# Patient Record
Sex: Female | Born: 1950 | Race: White | Hispanic: No | Marital: Married | Smoking: Former smoker
Health system: Southern US, Community
[De-identification: ages and names within clinical notes are randomized; demographics above are authoritative.]

## PROBLEM LIST (undated history)

## (undated) HISTORY — PX: TUBAL LIGATION: SHX77

## (undated) HISTORY — PX: EYE SURGERY: SHX253

---

## 1998-05-16 ENCOUNTER — Other Ambulatory Visit: Admission: RE | Admit: 1998-05-16 | Discharge: 1998-05-16 | Payer: Self-pay | Admitting: Obstetrics and Gynecology

## 1998-07-03 ENCOUNTER — Other Ambulatory Visit: Admission: RE | Admit: 1998-07-03 | Discharge: 1998-07-03 | Payer: Self-pay | Admitting: Obstetrics and Gynecology

## 1999-02-15 ENCOUNTER — Other Ambulatory Visit: Admission: RE | Admit: 1999-02-15 | Discharge: 1999-02-15 | Payer: Self-pay | Admitting: Obstetrics and Gynecology

## 2000-05-26 ENCOUNTER — Other Ambulatory Visit: Admission: RE | Admit: 2000-05-26 | Discharge: 2000-05-26 | Payer: Self-pay | Admitting: Obstetrics and Gynecology

## 2000-08-13 ENCOUNTER — Other Ambulatory Visit: Admission: RE | Admit: 2000-08-13 | Discharge: 2000-08-13 | Payer: Self-pay | Admitting: Obstetrics and Gynecology

## 2001-01-09 ENCOUNTER — Other Ambulatory Visit: Admission: RE | Admit: 2001-01-09 | Discharge: 2001-01-09 | Payer: Self-pay | Admitting: Obstetrics and Gynecology

## 2001-06-24 ENCOUNTER — Other Ambulatory Visit: Admission: RE | Admit: 2001-06-24 | Discharge: 2001-06-24 | Payer: Self-pay | Admitting: Obstetrics and Gynecology

## 2002-08-17 ENCOUNTER — Other Ambulatory Visit: Admission: RE | Admit: 2002-08-17 | Discharge: 2002-08-17 | Payer: Self-pay | Admitting: Obstetrics and Gynecology

## 2003-02-10 ENCOUNTER — Other Ambulatory Visit: Admission: RE | Admit: 2003-02-10 | Discharge: 2003-02-10 | Payer: Self-pay | Admitting: Obstetrics and Gynecology

## 2003-08-31 ENCOUNTER — Other Ambulatory Visit: Admission: RE | Admit: 2003-08-31 | Discharge: 2003-08-31 | Payer: Self-pay | Admitting: Obstetrics and Gynecology

## 2003-09-08 ENCOUNTER — Encounter: Admission: RE | Admit: 2003-09-08 | Discharge: 2003-09-08 | Payer: Self-pay | Admitting: Obstetrics and Gynecology

## 2005-02-14 ENCOUNTER — Other Ambulatory Visit: Admission: RE | Admit: 2005-02-14 | Discharge: 2005-02-14 | Payer: Self-pay | Admitting: Obstetrics and Gynecology

## 2005-03-04 ENCOUNTER — Encounter: Admission: RE | Admit: 2005-03-04 | Discharge: 2005-03-04 | Payer: Self-pay | Admitting: Obstetrics and Gynecology

## 2013-09-28 ENCOUNTER — Telehealth: Payer: Self-pay

## 2013-09-28 NOTE — Telephone Encounter (Signed)
Pt was referred by Dr. Karie Kirks for screening colonoscopy. Called and [facility] for a return call.

## 2013-09-28 NOTE — Telephone Encounter (Signed)
Pt was returning a call to the triage nurse that she wanted to wait before scheduling her colonoscopy due to losing her job this week. She will contact us when she is ready

## 2013-09-29 NOTE — Telephone Encounter (Signed)
Letter faxed to PCP.  

## 2013-09-29 NOTE — Telephone Encounter (Signed)
See separate phone note dated 09/28/2013. Pt to call when ready and letter sent to PCP.

## 2015-05-03 ENCOUNTER — Other Ambulatory Visit: Payer: Self-pay | Admitting: Obstetrics and Gynecology

## 2015-05-03 DIAGNOSIS — R928 Other abnormal and inconclusive findings on diagnostic imaging of breast: Secondary | ICD-10-CM

## 2015-05-19 ENCOUNTER — Ambulatory Visit
Admission: RE | Admit: 2015-05-19 | Discharge: 2015-05-19 | Disposition: A | Discharge status: Home / Self Care | Payer: 59 | Source: Ambulatory Visit | Attending: Obstetrics and Gynecology | Admitting: Obstetrics and Gynecology

## 2015-05-19 DIAGNOSIS — R928 Other abnormal and inconclusive findings on diagnostic imaging of breast: Secondary | ICD-10-CM

## 2015-10-23 ENCOUNTER — Other Ambulatory Visit: Payer: Self-pay | Admitting: Obstetrics and Gynecology

## 2015-10-23 DIAGNOSIS — N6320 Unspecified lump in the left breast, unspecified quadrant: Secondary | ICD-10-CM

## 2015-11-17 ENCOUNTER — Ambulatory Visit
Admission: RE | Admit: 2015-11-17 | Discharge: 2015-11-17 | Disposition: A | Discharge status: Home / Self Care | Payer: 59 | Source: Ambulatory Visit | Attending: Obstetrics and Gynecology | Admitting: Obstetrics and Gynecology

## 2015-11-17 DIAGNOSIS — N6320 Unspecified lump in the left breast, unspecified quadrant: Secondary | ICD-10-CM

## 2016-04-12 ENCOUNTER — Other Ambulatory Visit: Payer: Self-pay | Admitting: Obstetrics and Gynecology

## 2016-04-12 DIAGNOSIS — N63 Unspecified lump in unspecified breast: Secondary | ICD-10-CM

## 2016-05-10 ENCOUNTER — Ambulatory Visit
Admission: RE | Admit: 2016-05-10 | Discharge: 2016-05-10 | Disposition: A | Discharge status: Home / Self Care | Payer: 59 | Source: Ambulatory Visit | Attending: Obstetrics and Gynecology | Admitting: Obstetrics and Gynecology

## 2016-05-10 DIAGNOSIS — N63 Unspecified lump in unspecified breast: Secondary | ICD-10-CM

## 2016-05-21 ENCOUNTER — Other Ambulatory Visit: Payer: 59

## 2016-08-21 ENCOUNTER — Telehealth: Payer: Self-pay

## 2016-08-21 NOTE — Telephone Encounter (Signed)
Janett Billow from PCP office called to check the status of the referral they had sent. I told her we had the referral and the triage nurse would be contacting the patient.

## 2016-08-23 ENCOUNTER — Telehealth: Payer: Self-pay

## 2016-08-23 NOTE — Telephone Encounter (Signed)
Gastroenterology Pre-Procedure Review  Request Date: 08/23/2016 Requesting Physician: Dr. Karie Kirks  PATIENT REVIEW QUESTIONS: The patient responded to the following health history questions as indicated:    1. Diabetes Melitis: no 2. Joint replacements in the past 12 months: no 3. Major health problems in the past 3 months: no 4. Has an artificial valve or MVP: no 5. Has a defibrillator: no 6. Has been advised in past to take antibiotics in advance of a procedure like teeth cleaning: no 7. Family history of colon cancer: no  8. Alcohol Use: no 9. History of sleep apnea: no  10. History of coronary artery or other vascular stents placed within the last 12 months: no    MEDICATIONS & ALLERGIES:    Patient reports the following regarding taking any blood thinners:   Plavix? no Aspirin? no Coumadin? no Brilinta? no Xarelto? no Eliquis? no Pradaxa? no Savaysa? no Effient? no  Patient confirms/reports the following medications:  Current Outpatient Prescriptions  Medication Sig Dispense Refill  . loratadine (CLARITIN) 10 MG tablet Take 10 mg by mouth daily.     No current facility-administered medications for this visit.     Patient confirms/reports the following allergies:  No Known Allergies  No orders of the defined types were placed in this encounter.   AUTHORIZATION INFORMATION Primary Insurance:   ID #:   Group #:  Pre-Cert / Auth required:  Pre-Cert / Auth #:   Secondary Insurance:  ID #:   Group #:  Pre-Cert / Auth required:  Pre-Cert / Auth #:   SCHEDULE INFORMATION: Procedure has been scheduled as follows:  Date: 09/13/2016               Time:  8:30 AM Location: [facility] Short Stay  This Gastroenterology Pre-Precedure Review Form is being routed to the following provider(s): Dr. Oneida Alar

## 2016-08-23 NOTE — Telephone Encounter (Signed)
See separate triage.  

## 2016-09-02 ENCOUNTER — Other Ambulatory Visit: Payer: Self-pay

## 2016-09-02 DIAGNOSIS — Z1211 Encounter for screening for malignant neoplasm of colon: Secondary | ICD-10-CM

## 2016-09-02 NOTE — Telephone Encounter (Signed)
NO PA is needed 

## 2016-09-02 NOTE — Telephone Encounter (Signed)

## 2016-09-03 MED ORDER — NA SULFATE-K SULFATE-MG SULF 17.5-3.13-1.6 GM/177ML PO SOLN: 1.0000 | ORAL | 0 refills | Status: DC

## 2016-09-03 NOTE — Telephone Encounter (Signed)
Rx sent to the pharmacy and instructions mailed to pt.  

## 2016-09-13 ENCOUNTER — Encounter (HOSPITAL_COMMUNITY)
Admission: RE | Disposition: A | Discharge status: Home / Self Care | Payer: Self-pay | Source: Ambulatory Visit | Attending: Gastroenterology

## 2016-09-13 ENCOUNTER — Encounter (HOSPITAL_COMMUNITY): Payer: Self-pay | Admitting: Gastroenterology

## 2016-09-13 ENCOUNTER — Ambulatory Visit (HOSPITAL_COMMUNITY)
Admission: RE | Admit: 2016-09-13 | Discharge: 2016-09-13 | Disposition: A | Discharge status: Home / Self Care | Payer: 59 | Source: Ambulatory Visit | Attending: Gastroenterology | Admitting: Gastroenterology

## 2016-09-13 DIAGNOSIS — K648 Other hemorrhoids: Secondary | ICD-10-CM | POA: Insufficient documentation

## 2016-09-13 DIAGNOSIS — Z9851 Tubal ligation status: Secondary | ICD-10-CM | POA: Diagnosis not present

## 2016-09-13 DIAGNOSIS — D123 Benign neoplasm of transverse colon: Secondary | ICD-10-CM | POA: Diagnosis not present

## 2016-09-13 DIAGNOSIS — Z1211 Encounter for screening for malignant neoplasm of colon: Secondary | ICD-10-CM

## 2016-09-13 DIAGNOSIS — Q438 Other specified congenital malformations of intestine: Secondary | ICD-10-CM | POA: Insufficient documentation

## 2016-09-13 DIAGNOSIS — Z87891 Personal history of nicotine dependence: Secondary | ICD-10-CM | POA: Diagnosis not present

## 2016-09-13 DIAGNOSIS — D122 Benign neoplasm of ascending colon: Secondary | ICD-10-CM | POA: Diagnosis not present

## 2016-09-13 DIAGNOSIS — Z1212 Encounter for screening for malignant neoplasm of rectum: Secondary | ICD-10-CM

## 2016-09-13 HISTORY — PX: COLONOSCOPY: SHX5424

## 2016-09-13 SURGERY — COLONOSCOPY
Anesthesia: Moderate Sedation

## 2016-09-13 MED ORDER — MIDAZOLAM HCL 5 MG/5ML IJ SOLN
INTRAMUSCULAR | Status: AC
  Filled 2016-09-13: qty 5

## 2016-09-13 MED ORDER — STERILE WATER FOR IRRIGATION IR SOLN
Status: DC | PRN
  Administered 2016-09-13: 09:00:00

## 2016-09-13 MED ORDER — SODIUM CHLORIDE 0.9 % IJ SOLN
PREFILLED_SYRINGE | INTRAMUSCULAR | Status: DC | PRN
  Administered 2016-09-13: 2 mL

## 2016-09-13 MED ORDER — SODIUM CHLORIDE 0.9 % IV SOLN
INTRAVENOUS | Status: DC
  Administered 2016-09-13: 1000 mL via INTRAVENOUS

## 2016-09-13 MED ORDER — SPOT INK MARKER SYRINGE KIT
PACK | SUBMUCOSAL | Status: AC
  Filled 2016-09-13: qty 5

## 2016-09-13 MED ORDER — LIDOCAINE VISCOUS 2 % MT SOLN
OROMUCOSAL | Status: AC
  Filled 2016-09-13: qty 15

## 2016-09-13 MED ORDER — EPINEPHRINE PF 1 MG/10ML IJ SOSY
PREFILLED_SYRINGE | INTRAMUSCULAR | Status: AC
  Filled 2016-09-13: qty 10

## 2016-09-13 MED ORDER — SPOT INK MARKER SYRINGE KIT
PACK | SUBMUCOSAL | Status: DC | PRN
  Administered 2016-09-13: 1 mL via SUBMUCOSAL

## 2016-09-13 MED ORDER — MEPERIDINE HCL 100 MG/ML IJ SOLN
INTRAMUSCULAR | Status: DC | PRN
  Administered 2016-09-13 (×3): 25 mg via INTRAVENOUS

## 2016-09-13 MED ORDER — MEPERIDINE HCL 100 MG/ML IJ SOLN
INTRAMUSCULAR | Status: AC
  Filled 2016-09-13: qty 1

## 2016-09-13 MED ORDER — MIDAZOLAM HCL 5 MG/5ML IJ SOLN
INTRAMUSCULAR | Status: DC | PRN
  Administered 2016-09-13: 1 mg via INTRAVENOUS
  Administered 2016-09-13 (×2): 2 mg via INTRAVENOUS

## 2016-09-13 NOTE — Op Note (Signed)
[facility] Patient Name: Brandi Moyer Procedure Date: 09/13/2016 6:02 AM MRN: [id number] Date of Birth: 30-Sep-1950 Attending MD: Barney Drain , MD CSN: [id number] Age: 66 Admit Type: Outpatient Procedure:                Colonoscopy WITH SNARE POLYPECTOMY & SUBMUCOSAL                            INJECTION Indications:              Screening for colorectal malignant neoplasm Providers:                Barney Drain, MD, Janeece Riggers, RN, Rosina Lowenstein, RN Referring MD:             Newt Minion, MD Medicines:                Meperidine 75 mg IV, Midazolam 5 mg IV Complications:            No immediate complications. Estimated Blood Loss:     Estimated blood loss was minimal. Procedure:                Pre-Anesthesia Assessment:                           - Prior to the procedure, a History and Physical                            was performed, and patient medications and                            allergies were reviewed. The patient's tolerance of                            previous anesthesia was also reviewed. The risks                            and benefits of the procedure and the sedation                            options and risks were discussed with the patient.                            All questions were answered, and informed consent                            was obtained. Prior Anticoagulants: The patient has                            taken no previous anticoagulant or antiplatelet                            agents. ASA Grade Assessment: I - A normal, healthy                            patient. After reviewing the risks and benefits,  the patient was deemed in satisfactory condition to                            undergo the procedure. After obtaining informed                            consent, the colonoscope was passed under direct                            vision. Throughout the procedure, the patient's                            blood  pressure, pulse, and oxygen saturations were                            monitored continuously. The EC-[id number] ([id number])                            scope was introduced through the anus and advanced                            to the the cecum, identified by appendiceal orifice                            and ileocecal valve. The colonoscopy was performed                            without difficulty. The patient tolerated the                            procedure well. The quality of the bowel                            preparation was excellent. The ileocecal valve,                            appendiceal orifice, and rectum were photographed. Scope In: 9:04:15 AM Scope Out: 9:25:31 AM Scope Withdrawal Time: 0 hours 19 minutes 55 seconds  Total Procedure Duration: 0 hours 21 minutes 16 seconds  Findings:      A 15 mm polyp was found in the splenic flexure. The polyp was sessile.       The polyp was removed with a piecemeal technique using a hot snare.       Resection and retrieval were complete. Area was tattooed with an       injection of 1 mL of Spot (carbon black). Area was successfully injected       with 2 mL of a 1:10,000 solution of epinephrine for a lift polypectomy.       EDGES CAUTERIZED WITH TIP OF SNARE.      A 4 mm polyp was found in the proximal ascending colon. The polyp was       sessile. The polyp was removed with a cold snare. Resection and       retrieval were complete.      The recto-sigmoid colon was mildly redundant.  Internal hemorrhoids were found during retroflexion. The hemorrhoids       were moderate. Impression:               - One 15 mm polyp at the splenic flexure, removed                            piecemeal using a hot snare. Resected and                            retrieved. Tattooed. Injected.                           - One 4 mm polyp in the proximal ascending colon,                            removed with a cold snare. Resected and retrieved.                            - Redundant LEFT colon.                           - Internal hemorrhoids. Moderate Sedation:      Moderate (conscious) sedation was administered by the endoscopy nurse       and supervised by the endoscopist. The following parameters were       monitored: oxygen saturation, heart rate, blood pressure, and response       to care. Total physician intraservice time was 35 minutes. Recommendation:           - Repeat colonoscopy in 1 year for surveillance DUE                            TO PIECEMEAL RESECTION. ALL FIRST DEGREE RELATIVES                            NEED TCS AT AGE 25.                           - High fiber diet.                           - Continue present medications.                           - Await pathology results.                           - Patient has a contact number available for                            emergencies. The signs and symptoms of potential                            delayed complications were discussed with the  patient. Return to normal activities tomorrow.                            Written discharge instructions were provided to the                            patient. Procedure Code(s):        --- Professional ---                           [phone], Colonoscopy, flexible; with removal of                            tumor(s), polyp(s), or other lesion(s) by snare                            technique                           45381, Colonoscopy, flexible; with directed                            submucosal injection(s), any substance                           99152, Moderate sedation services provided by the                            same physician or other qualified health care                            professional performing the diagnostic or                            therapeutic service that the sedation supports,                            requiring the presence of an independent trained                             observer to assist in the monitoring of the                            patient's level of consciousness and physiological                            status; initial 15 minutes of intraservice time,                            patient age 64 years or older                           [phone], Moderate sedation services; each additional                            15 minutes intraservice  time Diagnosis Code(s):        --- Professional ---                           Z12.11, Encounter for screening for malignant                            neoplasm of colon                           D12.3, Benign neoplasm of transverse colon (hepatic                            flexure or splenic flexure)                           D12.2, Benign neoplasm of ascending colon                           K64.8, Other hemorrhoids                           Q43.8, Other specified congenital malformations of                            intestine CPT copyright 2016 American Medical Association. All rights reserved. The codes documented in this report are preliminary and upon coder review may  be revised to meet current compliance requirements. Barney Drain, MD Barney Drain, MD 09/13/2016 9:59:19 AM This report has been signed electronically. Number of Addenda: 0

## 2016-09-13 NOTE — H&P (Addendum)
  Primary Care Physician:  Robert Bellow, MD Primary Gastroenterologist:  Dr. Oneida Alar  Pre-Procedure History & Physical: HPI:  Brandi Moyer is a 66 y.o. female here for [place].  History reviewed. No pertinent past medical history.  Past Surgical History:  Procedure Laterality Date  . EYE SURGERY     does not remember which eye  . TUBAL LIGATION      Prior to Admission medications   Medication Sig Start Date End Date Taking? Authorizing Provider  loratadine (CLARITIN) 10 MG tablet Take 10 mg by mouth daily.   Yes Historical Provider, MD  Na Sulfate-K Sulfate-Mg Sulf (SUPREP BOWEL PREP KIT) 17.5-3.13-1.6 GM/180ML SOLN Take 1 kit by mouth as directed. 09/03/16  Yes Danie Binder, MD    Allergies as of 09/02/2016  . (No Known Allergies)    Family History  Problem Relation Age of Onset  . Breast cancer Mother   . Bone cancer Mother   . Heart Problems Father     Social History   Social History  . Marital status: Married    Spouse name: N/A  . Number of children: N/A  . Years of education: N/A   Occupational History  . Not on file.   Social History Main Topics  . Smoking status: Former Research scientist (life sciences)  . Smokeless tobacco: Never Used     Comment: quit 1988  . Alcohol use No  . Drug use: No  . Sexual activity: Not on file   Other Topics Concern  . Not on file   Social History Narrative  . No narrative on file    Review of Systems: See HPI, otherwise negative ROS   Physical Exam: BP (!) 141/77   Pulse 85   Temp 98.5 F (36.9 C) (Oral)   Resp 19   Ht 5' 2.5" (1.588 m)   Wt 192 lb (87.1 kg)   SpO2 100%   BMI 34.56 kg/m  General:   Alert,  pleasant and cooperative in NAD Head:  Normocephalic and atraumatic. Neck:  Supple; Lungs:  Clear throughout to auscultation.    Heart:  Regular rate and rhythm. Abdomen:  Soft, nontender and nondistended. Normal bowel sounds, without guarding, and without rebound.   Neurologic:  Alert and  oriented x4;   grossly normal neurologically.  Impression/Plan:     SCREENING  Plan:  1. TCS TODAY. DISCUSSED PROCEDURE, BENEFITS, & RISKS: < 1% chance of medication reaction, bleeding, perforation, or rupture of spleen/liver.

## 2016-09-13 NOTE — Discharge Instructions (Signed)
You had ONE SMALL AND ONE LARGE polyp removed. I TATTOOED AND PLACED TWO METAL CLIPS TO PREVENT BLEEDING FROM THE POLYP BASE IN 7-10 DAYS. You have MODERATE SIZE internal hemorrhoids.   NO MRI FOR 30 DAYS DUE TO METAL CLIP PLACEMENT IN THE COLON.  DRINK WATER TO KEEP YOUR URINE LIGHT YELLOW.  FOLLOW A HIGH FIBER DIET. AVOID ITEMS THAT CAUSE BLOATING & GAS. SEE INFO BELOW.  YOUR BIOPSY RESULTS WILL BE AVAILABLE IN MY CHART AFTER  APR 10 AND MY OFFICE WILL CONTACT YOU IN 10-14 DAYS WITH YOUR RESULTS.   Next colonoscopy in 1 YEAR. YOUR SISTERS, BROTHERS, CHILDREN, AND PARENTS NEED TO HAVE A COLONOSCOPY STARTING AT THE AGE OF 40.    Colonoscopy Care After Read the instructions outlined below and refer to this sheet in the next week. These discharge instructions provide you with general information on caring for yourself after you leave the hospital. While your treatment has been planned according to the most current medical practices available, unavoidable complications occasionally occur. If you have any problems or questions after discharge, call DR. Mykaylah Ballman, [phone].  ACTIVITY  You may resume your regular activity, but move at a slower pace for the next 24 hours.   Take frequent rest periods for the next 24 hours.   Walking will help get rid of the air and reduce the bloated feeling in your belly (abdomen).   No driving for 24 hours (because of the medicine (anesthesia) used during the test).   You may shower.   Do not sign any important legal documents or operate any machinery for 24 hours (because of the anesthesia used during the test).    NUTRITION  Drink plenty of fluids.   You may resume your normal diet as instructed by your doctor.   Begin with a light meal and progress to your normal diet. Heavy or fried foods are harder to digest and may make you feel sick to your stomach (nauseated).   Avoid alcoholic beverages for 24 hours or as instructed.     MEDICATIONS  You may resume your normal medications.   WHAT YOU CAN EXPECT TODAY  Some feelings of bloating in the abdomen.   Passage of more gas than usual.   Spotting of blood in your stool or on the toilet paper  .  IF YOU HAD POLYPS REMOVED DURING THE COLONOSCOPY:  Eat a soft diet IF YOU HAVE NAUSEA, BLOATING, ABDOMINAL PAIN, OR VOMITING.    FINDING OUT THE RESULTS OF YOUR TEST Not all test results are available during your visit. DR. Oneida Alar WILL CALL YOU WITHIN 14 DAYS OF YOUR PROCEDUE WITH YOUR RESULTS. Do not assume everything is normal if you have not heard from DR. Donald Memoli, CALL HER OFFICE AT [phone].  SEEK IMMEDIATE MEDICAL ATTENTION AND CALL THE OFFICE: [phone] IF:  You have more than a spotting of blood in your stool.   Your belly is swollen (abdominal distention).   You are nauseated or vomiting.   You have a temperature over 101F.   You have abdominal pain or discomfort that is severe or gets worse throughout the day.   High-Fiber Diet A high-fiber diet changes your normal diet to include more whole grains, legumes, fruits, and vegetables. Changes in the diet involve replacing refined carbohydrates with unrefined foods. The calorie level of the diet is essentially unchanged. The Dietary Reference Intake (recommended amount) for adult males is 38 grams per day. For adult females, it is 25 grams per day.  Pregnant and lactating women should consume 28 grams of fiber per day. Fiber is the intact part of a plant that is not broken down during digestion. Functional fiber is fiber that has been isolated from the plant to provide a beneficial effect in the body. PURPOSE  Increase stool bulk.   Ease and regulate bowel movements.   Lower cholesterol.   REDUCE RISK OF COLON CANCER  INDICATIONS THAT YOU NEED MORE FIBER  Constipation and hemorrhoids.   Uncomplicated diverticulosis (intestine condition) and irritable bowel syndrome.   Weight  management.   As a protective measure against hardening of the arteries (atherosclerosis), diabetes, and cancer.   GUIDELINES FOR INCREASING FIBER IN THE DIET  Start adding fiber to the diet slowly. A gradual increase of about 5 more grams (2 slices of whole-wheat bread, 2 servings of most fruits or vegetables, or 1 bowl of high-fiber cereal) per day is best. Too rapid an increase in fiber may result in constipation, flatulence, and bloating.   Drink enough water and fluids to keep your urine clear or pale yellow. Water, juice, or caffeine-free drinks are recommended. Not drinking enough fluid may cause constipation.   Eat a variety of high-fiber foods rather than one type of fiber.   Try to increase your intake of fiber through using high-fiber foods rather than fiber pills or supplements that contain small amounts of fiber.   The goal is to change the types of food eaten. Do not supplement your present diet with high-fiber foods, but replace foods in your present diet.   INCLUDE A VARIETY OF FIBER SOURCES  Replace refined and processed grains with whole grains, canned fruits with fresh fruits, and incorporate other fiber sources. White rice, white breads, and most bakery goods contain little or no fiber.   Brown whole-grain rice, buckwheat oats, and many fruits and vegetables are all good sources of fiber. These include: broccoli, Brussels sprouts, cabbage, cauliflower, beets, sweet potatoes, white potatoes (skin on), carrots, tomatoes, eggplant, squash, berries, fresh fruits, and dried fruits.   Cereals appear to be the richest source of fiber. Cereal fiber is found in whole grains and bran. Bran is the fiber-rich outer coat of cereal grain, which is largely removed in refining. In whole-grain cereals, the bran remains. In breakfast cereals, the largest amount of fiber is found in those with "bran" in their names. The fiber content is sometimes indicated on the label.   You may need to  include additional fruits and vegetables each day.   In baking, for 1 cup white flour, you may use the following substitutions:   1 cup whole-wheat flour minus 2 tablespoons.   1/2 cup white flour plus 1/2 cup whole-wheat flour.   Polyps, Colon  A polyp is extra tissue that grows inside your body. Colon polyps grow in the large intestine. The large intestine, also called the colon, is part of your digestive system. It is a long, hollow tube at the end of your digestive tract where your body makes and stores stool. Most polyps are not dangerous. They are benign. This means they are not cancerous. But over time, some types of polyps can turn into cancer. Polyps that are smaller than a pea are usually not harmful. But larger polyps could someday become or may already be cancerous. To be safe, doctors remove all polyps and test them.   WHO GETS POLYPS? Anyone can get polyps, but certain people are more likely than others. You may have a greater chance of  getting polyps if:  You are over 50.   You have had polyps before.   Someone in your family has had polyps.   Someone in your family has had cancer of the large intestine.   Find out if someone in your family has had polyps. You may also be more likely to get polyps if you:   Eat a lot of fatty foods   Smoke   Drink alcohol   Do not exercise  Eat too much    PREVENTION There is not one sure way to prevent polyps. You might be able to lower your risk of getting them if you:  Eat more fruits and vegetables and less fatty food.   Do not smoke.   Avoid alcohol.   Exercise every day.   Lose weight if you are overweight.   Eating more calcium and folate can also lower your risk of getting polyps. Some foods that are rich in calcium are milk, cheese, and broccoli. Some foods that are rich in folate are chickpeas, kidney beans, and spinach.   Hemorrhoids Hemorrhoids are dilated (enlarged) veins around the rectum. Sometimes clots  will form in the veins. This makes them swollen and painful. These are called thrombosed hemorrhoids. Causes of hemorrhoids include:  Constipation.   Straining to have a bowel movement.   HEAVY LIFTING  HOME CARE INSTRUCTIONS  Eat a well balanced diet and drink 6 to 8 glasses of water every day to avoid constipation. You may also use a bulk laxative.   Avoid straining to have bowel movements.   Keep anal area dry and clean.   Do not use a donut shaped pillow or sit on the toilet for long periods. This increases blood pooling and pain.   Move your bowels when your body has the urge; this will require less straining and will decrease pain and pressure.

## 2016-09-17 ENCOUNTER — Encounter (HOSPITAL_COMMUNITY): Payer: Self-pay | Admitting: Gastroenterology

## 2016-09-18 ENCOUNTER — Telehealth: Payer: Self-pay | Admitting: Gastroenterology

## 2016-09-18 NOTE — Telephone Encounter (Signed)
Please call pt. 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   NO MRI FOR 30 DAYS DUE TO METAL CLIP PLACEMENT IN THE COLON.  DRINK WATER TO KEEP YOUR URINE LIGHT YELLOW.  FOLLOW A HIGH FIBER DIET. AVOID ITEMS THAT CAUSE BLOATING & GAS.   Next colonoscopy in 1 YEAR. YOUR SISTERS, BROTHERS, CHILDREN, AND PARENTS NEED TO HAVE A COLONOSCOPY STARTING AT THE AGE OF 40.

## 2016-09-19 NOTE — Telephone Encounter (Signed)
Reminder in epic °

## 2016-09-19 NOTE — Telephone Encounter (Signed)
LMOM to call.

## 2016-09-19 NOTE — Telephone Encounter (Signed)
LM for pt to call

## 2016-09-19 NOTE — Telephone Encounter (Signed)
Pt is aware.  

## 2017-08-06 ENCOUNTER — Encounter: Payer: Self-pay | Admitting: Gastroenterology

## 2017-09-08 IMAGING — MG 2D DIGITAL DIAGNOSTIC BILATERAL MAMMOGRAM WITH CAD AND ADJUNCT T
8 of 12 series · 8 of 28 positions shown · non-contrast
Comparison: Previous exam(s).

CLINICAL DATA: Follow-up of probably benign left breast asymmetry.

EXAM:
2D DIGITAL DIAGNOSTIC BILATERAL MAMMOGRAM WITH CAD AND ADJUNCT TOMO

[L MLO synth-2D]
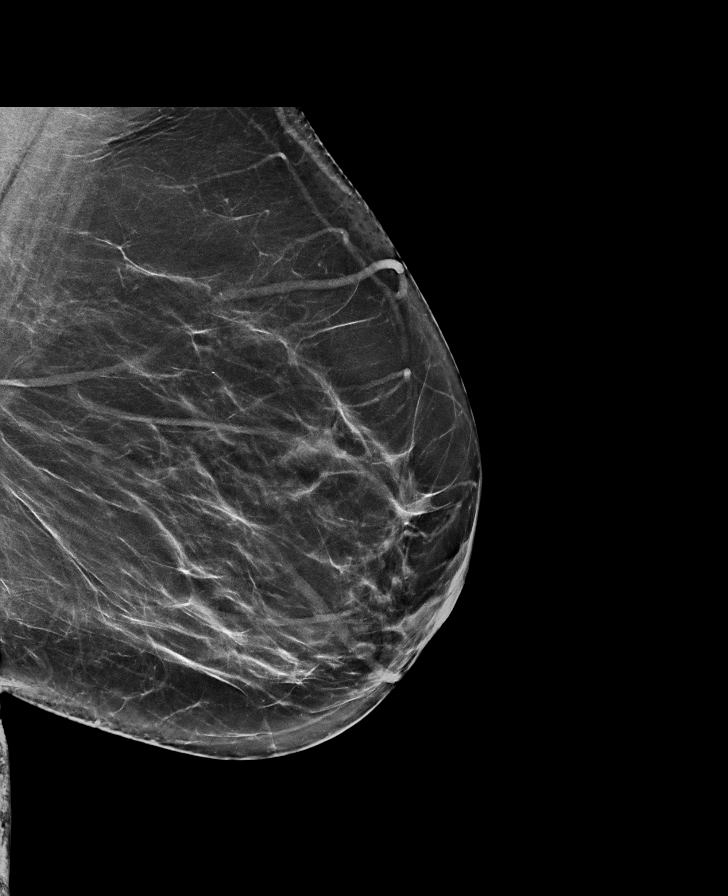

[L CC synth-2D]
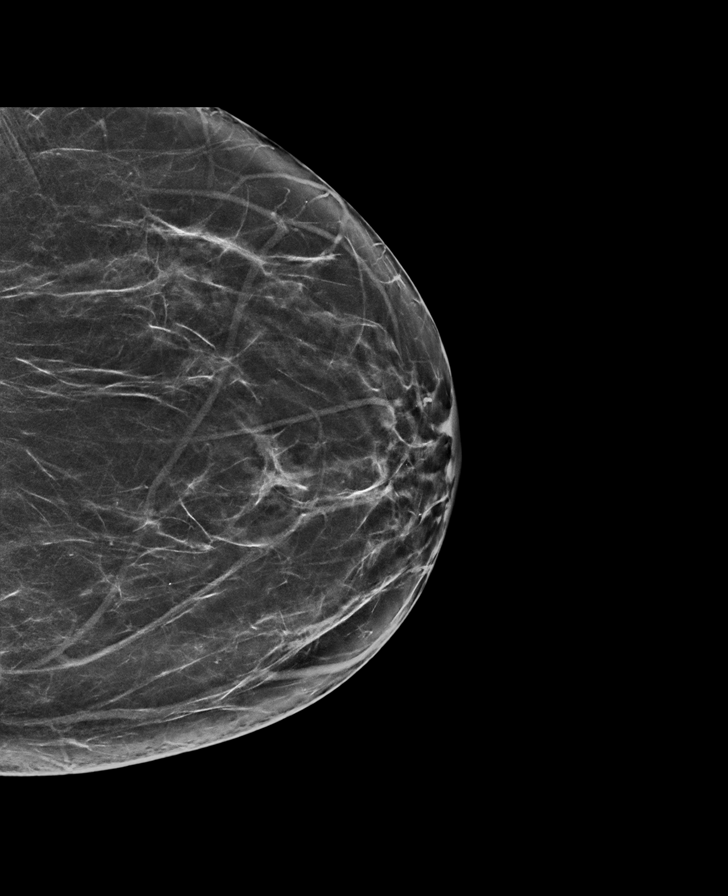

[R CC synth-2D]
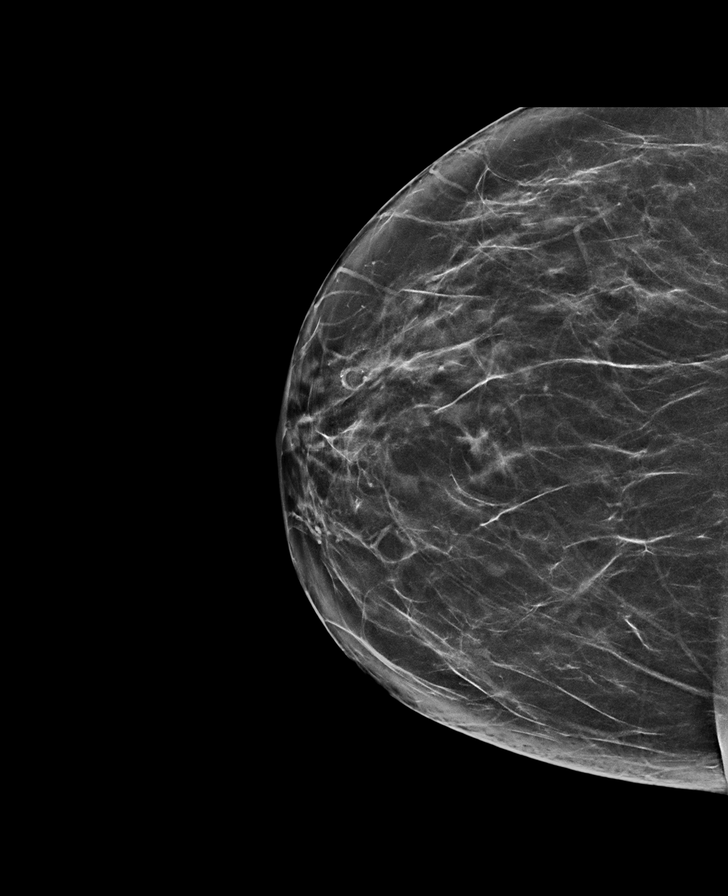

[R CC]
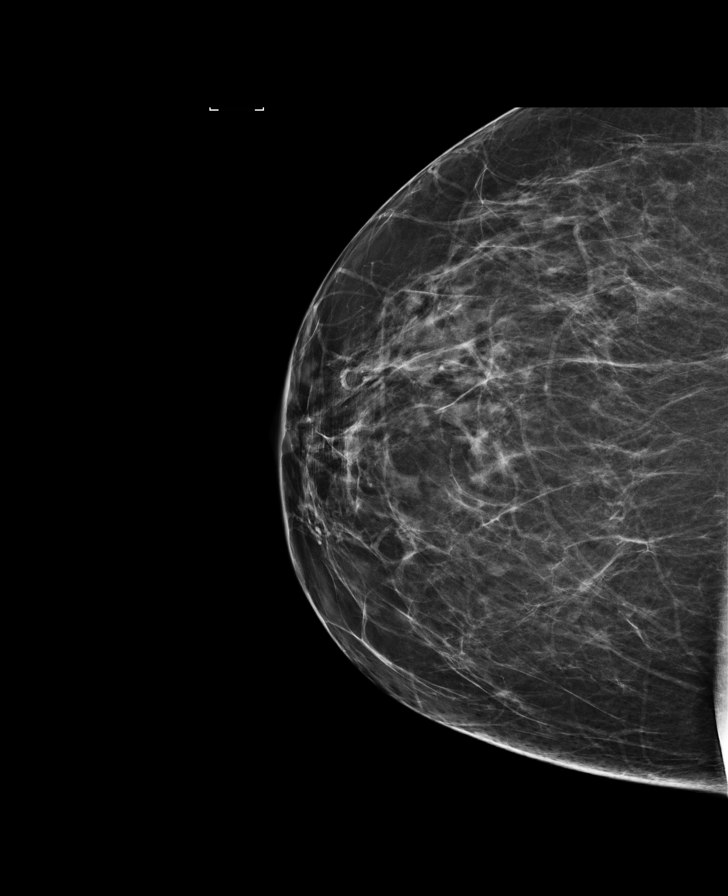

[L MLO]
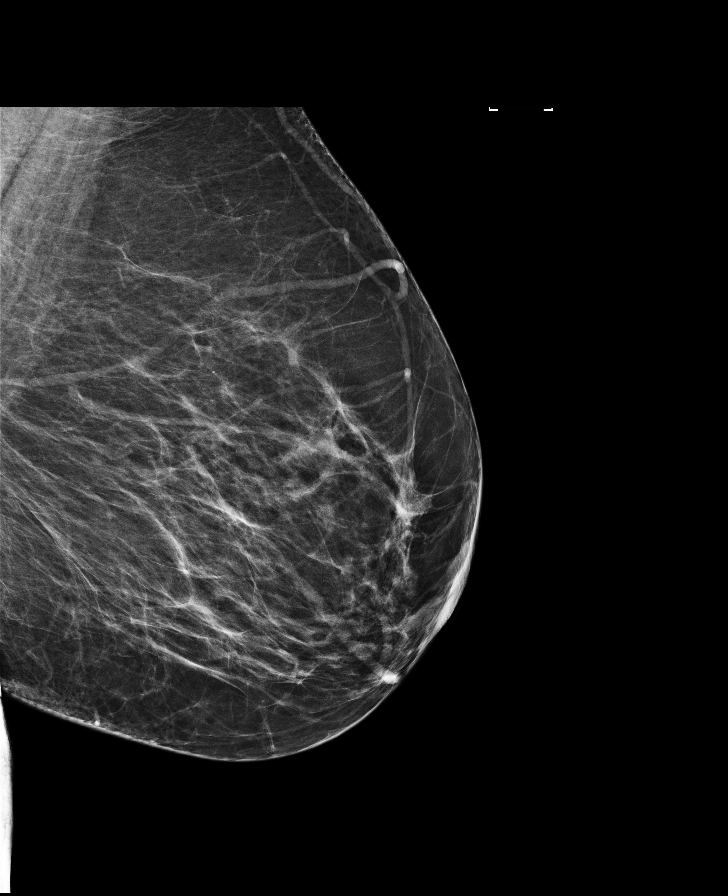

[R MLO synth-2D]
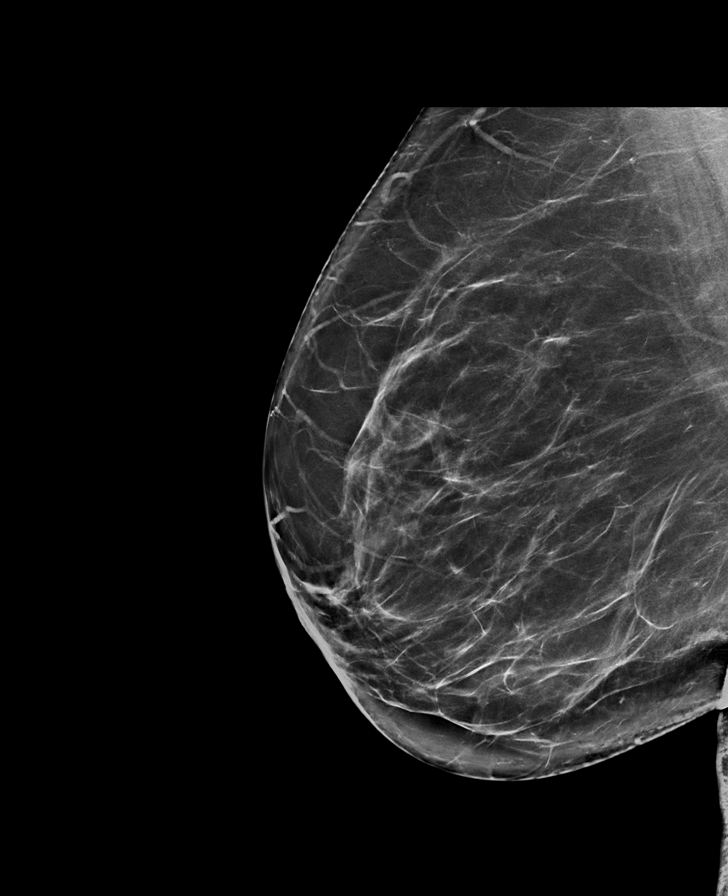

[L CC]
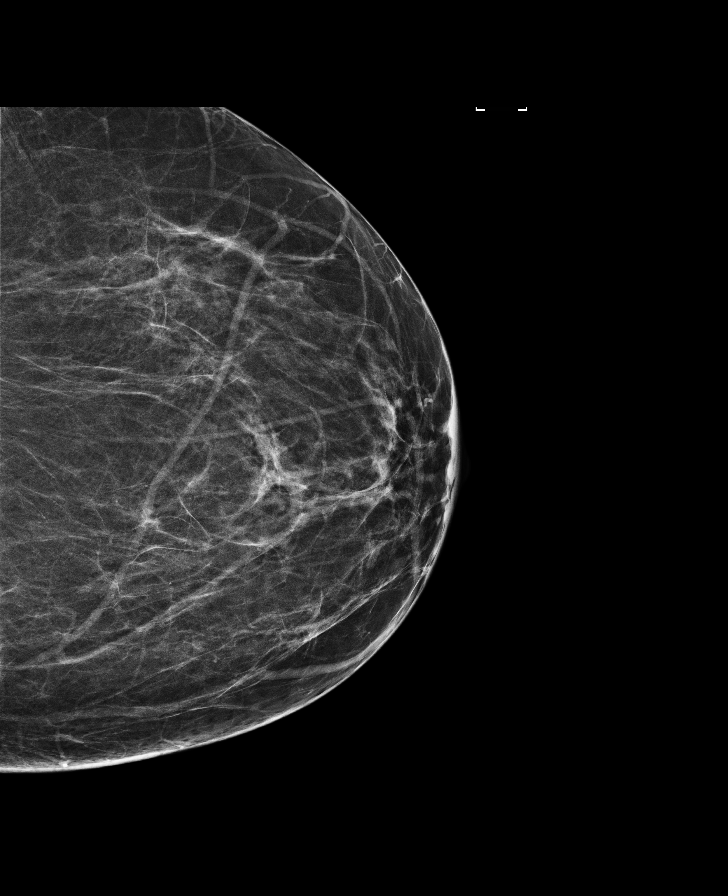

[R MLO]
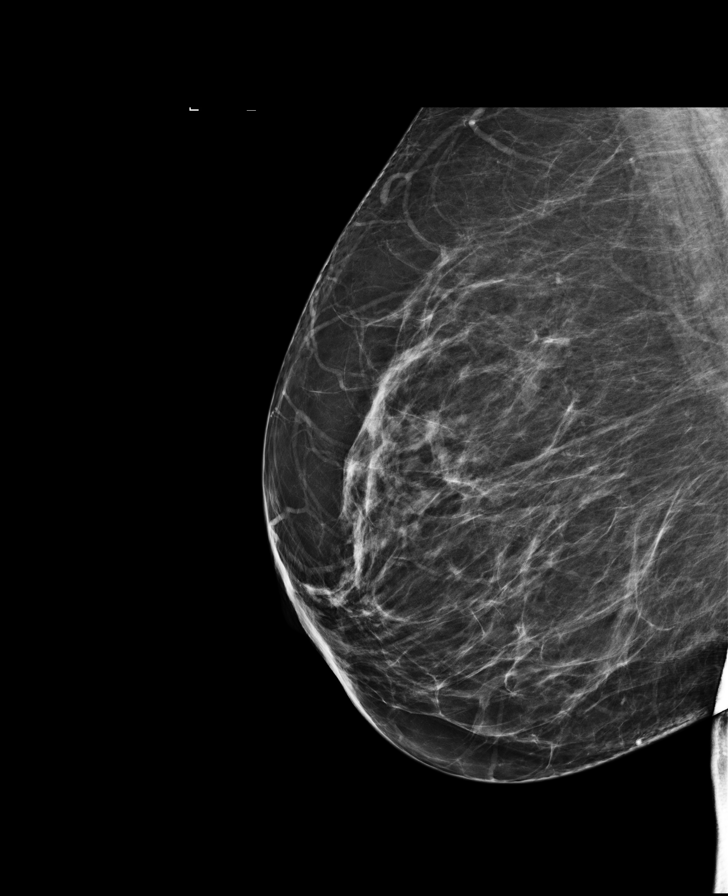

[8 of 28 positions shown; findings below may reference images not displayed]

ACR Breast Density Category b: There are scattered areas of
fibroglandular density.
FINDINGS: Mammographically, there are no suspicious masses, areas of
architectural distortion or microcalcifications in either breast.
The previously noted 6 mm asymmetry in the posterior left breast is
less conspicuous consistent with benign etiology. No new areas of
concern are seen.

Mammographic images were processed with CAD.
IMPRESSION: No mammographic evidence of malignancy in either breast.

RECOMMENDATION:
Screening mammogram in one year.(Code:2M-0-YL0)

I have discussed the findings and recommendations with the patient.
Results were also provided in writing at the conclusion of the
visit. If applicable, a reminder letter will be sent to the patient
regarding the next appointment.

BI-RADS CATEGORY  1: Negative.

## 2017-09-12 ENCOUNTER — Telehealth: Payer: Self-pay

## 2017-09-12 NOTE — Telephone Encounter (Signed)
Pt received a letter to schedule a nurse visit. Patient works in [employer] and can't take time off work. Patient asked if the nurse could do a phone triage on a Friday. I told her that JL wasn't here today and I would have to ask her first. Please call patient next Friday at [phone]

## 2017-09-24 NOTE — Telephone Encounter (Signed)
Brandi Moyer, per policy, the pt has to come in for an office visit, there is paperwork that needs to be signed. Rosendo Gros is going to try to contact the pt.

## 2017-09-24 NOTE — Telephone Encounter (Signed)
I tried to call the patient, and lm for her to call back.  Please see note below from [place] and let the patient know our policy.

## 2023-02-13 ENCOUNTER — Encounter: Payer: Self-pay | Admitting: *Deleted

## 2023-02-24 ENCOUNTER — Telehealth: Payer: Self-pay | Admitting: Internal Medicine

## 2023-02-24 NOTE — Telephone Encounter (Signed)
Questionnaire in review

## 2023-02-27 NOTE — Telephone Encounter (Signed)
  Procedure: Colonoscopy  Height: 5'2.5 Weight: 199lbs        Have you had a colonoscopy before?  09/13/16, Dr. Darrick Penna  Do you have family history of colon cancer?  no  Do you have a family history of polyps? no  Previous colonoscopy with polyps removed? yes  Do you have a history colorectal cancer?   no  Are you diabetic?  no  Do you have a prosthetic or mechanical heart valve? no  Do you have a pacemaker/defibrillator?   no  Have you had endocarditis/atrial fibrillation?  no  Do you use supplemental oxygen/CPAP?  no  Have you had joint replacement within the last 12 months?  no  Do you tend to be constipated or have to use laxatives?  no   Do you have history of alcohol use? If yes, how much and how often.  no  Do you have history or are you using drugs? If yes, what do are you  using?  no  Have you ever had a stroke/heart attack?  no  Have you ever had a heart or other vascular stent placed,?no  Do you take weight loss medication? no  female patients,: have you had a hysterectomy? no                              are you post menopausal?  yes                              do you still have your menstrual cycle? no    Date of last menstrual period?   Do you take any blood-thinning medications such as: (Plavix, aspirin, Coumadin, Aggrenox, Brilinta, Xarelto, Eliquis, Pradaxa, Savaysa or Effient)? no  If yes we need the name, milligram, dosage and who is prescribing doctor:               Current Outpatient Medications  Medication Sig Dispense Refill   omeprazole (PRILOSEC) 20 MG capsule Take 20 mg by mouth daily.     loratadine (CLARITIN) 10 MG tablet Take 10 mg by mouth daily.     No current facility-administered medications for this visit.    No Known Allergies

## 2023-03-12 NOTE — Telephone Encounter (Addendum)
Spoke with pt. She is scheduled for 10/8 with Dr. Marletta Lor. She will come by [address] to pick up instructions.  PA submitted via cohere. Authorization #[id number], DOS: 03/18/2023 - 05/18/2023

## 2023-03-12 NOTE — Telephone Encounter (Signed)
OK to schedule. ASA 2.  ?

## 2023-03-13 ENCOUNTER — Encounter: Payer: Self-pay | Admitting: *Deleted

## 2023-03-13 NOTE — Telephone Encounter (Signed)
Referral completed, [facility] apt letter sent to PCP

## 2023-03-18 ENCOUNTER — Other Ambulatory Visit: Payer: Self-pay

## 2023-03-18 ENCOUNTER — Ambulatory Visit (HOSPITAL_COMMUNITY): Payer: Medicare HMO | Admitting: Anesthesiology

## 2023-03-18 ENCOUNTER — Encounter (HOSPITAL_COMMUNITY): Payer: Self-pay

## 2023-03-18 ENCOUNTER — Encounter (HOSPITAL_COMMUNITY)
Admission: RE | Disposition: A | Discharge status: Home / Self Care | Payer: Self-pay | Source: Home / Self Care | Attending: Internal Medicine

## 2023-03-18 ENCOUNTER — Ambulatory Visit (HOSPITAL_COMMUNITY)
Admission: RE | Admit: 2023-03-18 | Discharge: 2023-03-18 | Disposition: A | Discharge status: Home / Self Care | Payer: Medicare HMO | Attending: Internal Medicine | Admitting: Internal Medicine

## 2023-03-18 DIAGNOSIS — Z860101 Personal history of adenomatous and serrated colon polyps: Secondary | ICD-10-CM

## 2023-03-18 DIAGNOSIS — K6389 Other specified diseases of intestine: Secondary | ICD-10-CM

## 2023-03-18 DIAGNOSIS — D122 Benign neoplasm of ascending colon: Secondary | ICD-10-CM

## 2023-03-18 DIAGNOSIS — Z860100 Personal history of colon polyps, unspecified: Secondary | ICD-10-CM

## 2023-03-18 DIAGNOSIS — K648 Other hemorrhoids: Secondary | ICD-10-CM

## 2023-03-18 DIAGNOSIS — K635 Polyp of colon: Secondary | ICD-10-CM | POA: Diagnosis not present

## 2023-03-18 DIAGNOSIS — D124 Benign neoplasm of descending colon: Secondary | ICD-10-CM

## 2023-03-18 DIAGNOSIS — Z1211 Encounter for screening for malignant neoplasm of colon: Secondary | ICD-10-CM | POA: Diagnosis not present

## 2023-03-18 DIAGNOSIS — Z87891 Personal history of nicotine dependence: Secondary | ICD-10-CM | POA: Diagnosis not present

## 2023-03-18 HISTORY — PX: POLYPECTOMY: SHX5525

## 2023-03-18 HISTORY — PX: COLONOSCOPY WITH PROPOFOL: SHX5780

## 2023-03-18 SURGERY — COLONOSCOPY WITH PROPOFOL
Anesthesia: General

## 2023-03-18 MED ORDER — LACTATED RINGERS IV SOLN: INTRAVENOUS | Status: DC

## 2023-03-18 MED ORDER — PROPOFOL 500 MG/50ML IV EMUL
INTRAVENOUS | Status: DC | PRN
  Administered 2023-03-18: 150 ug/kg/min via INTRAVENOUS

## 2023-03-18 MED ORDER — LACTATED RINGERS IV SOLN: INTRAVENOUS | Status: DC | PRN

## 2023-03-18 MED ORDER — PROPOFOL 10 MG/ML IV BOLUS
INTRAVENOUS | Status: DC | PRN
  Administered 2023-03-18: 50 mg via INTRAVENOUS
  Administered 2023-03-18: 100 mg via INTRAVENOUS

## 2023-03-18 MED ORDER — STERILE WATER FOR IRRIGATION IR SOLN
Status: DC | PRN
  Administered 2023-03-18: 60 mL

## 2023-03-18 NOTE — Transfer of Care (Signed)
Immediate Anesthesia Transfer of Care Note  Patient: Brandi Moyer  Procedure(s) Performed: COLONOSCOPY WITH PROPOFOL POLYPECTOMY  Patient Location: Short Stay  Anesthesia Type:General  Level of Consciousness: awake, alert , oriented, and patient cooperative  Airway & Oxygen Therapy: Patient Spontanous Breathing  Post-op Assessment: Report given to RN, Post -op Vital signs reviewed and stable, and Patient moving all extremities X 4  Post vital signs: Reviewed and stable  Last Vitals:  Vitals Value Taken Time  BP 99/52 03/18/23 0902  Temp    Pulse 95 03/18/23 0902  Resp 95 03/18/23 0902  SpO2 20 % 03/18/23 0902    Last Pain:  Vitals:   03/18/23 0902  TempSrc: Oral  PainSc: 0-No pain      Patients Stated Pain Goal: 8 (03/18/23 0721)  Complications: No notable events documented.

## 2023-03-18 NOTE — H&P (Signed)
Primary Care Physician:  Lupita Raider, NP Primary Gastroenterologist:  Dr. Marletta Lor  Pre-Procedure History & Physical: HPI:  Brandi Moyer is a 72 y.o. female is here for a colonoscopy to be performed for surveillance purposes, personal history of adenomatous colon polyps in 2018  History reviewed. No pertinent past medical history.  Past Surgical History:  Procedure Laterality Date   COLONOSCOPY N/A 09/13/2016   Procedure: COLONOSCOPY;  Surgeon: West Bali, MD;  Location: [facility];  Service: Endoscopy;  Laterality: N/A;  8:30 AM   EYE SURGERY     does not remember which eye   TUBAL LIGATION      Prior to Admission medications   Medication Sig Start Date End Date Taking? Authorizing Provider  loratadine (CLARITIN) 10 MG tablet Take 10 mg by mouth daily.   Yes [provider]  omeprazole (PRILOSEC) 20 MG capsule Take 20 mg by mouth daily.   Yes [provider]    Allergies as of 03/12/2023   (No Known Allergies)    Family History  Problem Relation Age of Onset   Breast cancer Mother    Bone cancer Mother    Heart Problems Father     Social History   Socioeconomic History   Marital status: Married    Spouse name: Not on file   Number of children: Not on file   Years of education: Not on file   Highest education level: Not on file  Occupational History   Not on file  Tobacco Use   Smoking status: Former   Smokeless tobacco: Never   Tobacco comments:    quit 1988  Substance and Sexual Activity   Alcohol use: No   Drug use: No   Sexual activity: Not on file  Other Topics Concern   Not on file  Social History Narrative   Not on file   Social Determinants of Health   Financial Resource Strain: Not on file  Food Insecurity: Not on file  Transportation Needs: Not on file  Physical Activity: Not on file  Stress: Not on file  Social Connections: Not on file  Intimate Partner Violence: Not on file    Review of Systems: See HPI,  otherwise negative ROS  Physical Exam: Vital signs in last 24 hours: Temp:  [98.2 F (36.8 C)] 98.2 F (36.8 C) (10/08 0721) Pulse Rate:  [79] 79 (10/08 0721) Resp:  [21] 21 (10/08 0721) BP: (151)/(78) 151/78 (10/08 0721) SpO2:  [20 %] 20 % (10/08 0721) Weight:  [89.8 kg] 89.8 kg (10/08 0721)   General:   Alert,  Well-developed, well-nourished, pleasant and cooperative in NAD Head:  Normocephalic and atraumatic. Eyes:  Sclera clear, no icterus.   Conjunctiva pink. Ears:  Normal auditory acuity. Nose:  No deformity, discharge,  or lesions. Msk:  Symmetrical without gross deformities. Normal posture. Extremities:  Without clubbing or edema. Neurologic:  Alert and  oriented x4;  grossly normal neurologically. Skin:  Intact without significant lesions or rashes. Psych:  Alert and cooperative. Normal mood and affect.  Impression/Plan: Brandi Moyer is here for a colonoscopy to be performed for surveillance purposes, personal history of adenomatous colon polyps in 2018  The risks of the procedure including infection, bleed, or perforation as well as benefits, limitations, alternatives and imponderables have been reviewed with the patient. Questions have been answered. All parties agreeable.

## 2023-03-18 NOTE — Discharge Instructions (Addendum)
°  Colonoscopy Discharge Instructions  Read the instructions outlined below and refer to this sheet in the next few weeks. These discharge instructions provide you with general information on caring for yourself after you leave the hospital. Your doctor may also give you specific instructions. While your treatment has been planned according to the most current medical practices available, unavoidable complications occasionally occur.   ACTIVITY You may resume your regular activity, but move at a slower pace for the next 24 hours.  Take frequent rest periods for the next 24 hours.  Walking will help get rid of the air and reduce the bloated feeling in your belly (abdomen).  No driving for 24 hours (because of the medicine (anesthesia) used during the test).   Do not sign any important legal documents or operate any machinery for 24 hours (because of the anesthesia used during the test).  NUTRITION Drink plenty of fluids.  You may resume your normal diet as instructed by your doctor.  Begin with a light meal and progress to your normal diet. Heavy or fried foods are harder to digest and may make you feel sick to your stomach (nauseated).  Avoid alcoholic beverages for 24 hours or as instructed.  MEDICATIONS You may resume your normal medications unless your doctor tells you otherwise.  WHAT YOU CAN EXPECT TODAY Some feelings of bloating in the abdomen.  Passage of more gas than usual.  Spotting of blood in your stool or on the toilet paper.  IF YOU HAD POLYPS REMOVED DURING THE COLONOSCOPY: No aspirin products for 7 days or as instructed.  No alcohol for 7 days or as instructed.  Eat a soft diet for the next 24 hours.  FINDING OUT THE RESULTS OF YOUR TEST Not all test results are available during your visit. If your test results are not back during the visit, make an appointment with your caregiver to find out the results. Do not assume everything is normal if you have not heard from your  caregiver or the medical facility. It is important for you to follow up on all of your test results.  SEEK IMMEDIATE MEDICAL ATTENTION IF: You have more than a spotting of blood in your stool.  Your belly is swollen (abdominal distention).  You are nauseated or vomiting.  You have a temperature over 101.  You have abdominal pain or discomfort that is severe or gets worse throughout the day.   Your colonoscopy revealed 5 polyp(s) which I removed successfully. Await pathology results, my office will contact you. I recommend repeating colonoscopy in 5 years for surveillance purposes. Otherwise follow up with GI as needed.    I hope you have a great rest of your week!  Charles K. Carver, D.O. Gastroenterology and Hepatology [facility]  

## 2023-03-18 NOTE — Op Note (Signed)
[facility] Patient Name: Brandi Moyer Procedure Date: 03/18/2023 8:25 AM MRN: [id number] Date of Birth: 06-Nov-1950 Attending MD: Hennie Duos. Marletta Lor , [place], [id number] CSN: [id number] Age: 72 Admit Type: Outpatient Procedure:                Colonoscopy Indications:              Surveillance: Personal history of adenomatous                            polyps on last colonoscopy > 5 years ago Providers:                Hennie Duos. Marletta Lor, DO, Crystal Page, Francoise Ceo                            RN, RN, Elinor Parkinson Referring MD:              Medicines:                See the Anesthesia note for documentation of the                            administered medications Complications:            No immediate complications. Estimated Blood Loss:     Estimated blood loss was minimal. Procedure:                Pre-Anesthesia Assessment:                           - The anesthesia plan was to use monitored                            anesthesia care (MAC).                           After obtaining informed consent, the colonoscope                            was passed under direct vision. Throughout the                            procedure, the patient's blood pressure, pulse, and                            oxygen saturations were monitored continuously. The                            [id number]-HQ190L ([id number]) scope was introduced through                            the anus and advanced to the the cecum, identified                            by appendiceal orifice and ileocecal valve. The                            colonoscopy  was performed without difficulty. The                            patient tolerated the procedure well. The quality                            of the bowel preparation was evaluated using the                            BBPS [facility] Bowel Preparation Scale) with scores                            of: Right Colon = 2 (minor amount of residual                             staining, small fragments of stool and/or opaque                            liquid, but mucosa seen well), Transverse Colon = 3                            (entire mucosa seen well with no residual staining,                            small fragments of stool or opaque liquid) and Left                            Colon = 2 (minor amount of residual staining, small                            fragments of stool and/or opaque liquid, but mucosa                            seen well). The total BBPS score equals 7. The                            quality of the bowel preparation was good. Scope In: 8:41:34 AM Scope Out: 8:59:52 AM Scope Withdrawal Time: 0 hours 16 minutes 56 seconds  Total Procedure Duration: 0 hours 18 minutes 18 seconds  Findings:      Non-bleeding internal hemorrhoids were found during endoscopy.      A 4 mm polyp was found in the ascending colon. The polyp was sessile.       The polyp was removed with a cold snare. Resection and retrieval were       complete.      Four sessile polyps were found in the sigmoid colon and descending       colon. The polyps were 4 to 6 mm in size. These polyps were removed with       a cold snare. Resection and retrieval were complete.      A tattoo was seen at the splenic flexure. The tattoo site appeared       normal. Impression:               -  Non-bleeding internal hemorrhoids.                           - One 4 mm polyp in the ascending colon, removed                            with a cold snare. Resected and retrieved.                           - Four 4 to 6 mm polyps in the sigmoid colon and in                            the descending colon, removed with a cold snare.                            Resected and retrieved.                           - A tattoo was seen at the splenic flexure. The                            tattoo site appeared normal. Moderate Sedation:      Per Anesthesia Care Recommendation:           - Patient has a  contact number available for                            emergencies. The signs and symptoms of potential                            delayed complications were discussed with the                            patient. Return to normal activities tomorrow.                            Written discharge instructions were provided to the                            patient.                           - Resume previous diet.                           - Continue present medications.                           - Await pathology results.                           - Repeat colonoscopy in 5 years for surveillance.                           - Return to GI clinic PRN. Procedure Code(s):        ---  Professional ---                           [phone], Colonoscopy, flexible; with removal of                            tumor(s), polyp(s), or other lesion(s) by snare                            technique Diagnosis Code(s):        --- Professional ---                           Z86.010, Personal history of colonic polyps                           D12.2, Benign neoplasm of ascending colon                           D12.5, Benign neoplasm of sigmoid colon                           D12.4, Benign neoplasm of descending colon                           K64.8, Other hemorrhoids CPT copyright 2022 American Medical Association. All rights reserved. The codes documented in this report are preliminary and upon coder review may  be revised to meet current compliance requirements. Hennie Duos. Marletta Lor, DO Hennie Duos. Lyrique Hakim, DO 03/18/2023 9:05:07 AM This report has been signed electronically. Number of Addenda: 0

## 2023-03-18 NOTE — Anesthesia Preprocedure Evaluation (Signed)
Anesthesia Evaluation  Patient identified by MRN, date of birth, ID band Patient awake    Reviewed: Allergy & Precautions, H&P , NPO status , Patient's Chart, lab work & pertinent test results, reviewed documented beta blocker date and time   Airway Mallampati: II  TM Distance: >3 FB Neck ROM: full    Dental no notable dental hx.    Pulmonary neg pulmonary ROS, Patient abstained from smoking., former smoker   Pulmonary exam normal breath sounds clear to auscultation       Cardiovascular Exercise Tolerance: Good negative cardio ROS  Rhythm:regular Rate:Normal     Neuro/Psych negative neurological ROS  negative psych ROS   GI/Hepatic negative GI ROS, Neg liver ROS,,,  Endo/Other  negative endocrine ROS    Renal/GU negative Renal ROS  negative genitourinary   Musculoskeletal   Abdominal   Peds  Hematology negative hematology ROS (+)   Anesthesia Other Findings   Reproductive/Obstetrics negative OB ROS                             Anesthesia Physical Anesthesia Plan  ASA: 2  Anesthesia Plan: General   Post-op Pain Management:    Induction:   PONV Risk Score and Plan:   Airway Management Planned:   Additional Equipment:   Intra-op Plan:   Post-operative Plan:   Informed Consent: I have reviewed the patients History and Physical, chart, labs and discussed the procedure including the risks, benefits and alternatives for the proposed anesthesia with the patient or authorized representative who has indicated his/her understanding and acceptance.     Dental Advisory Given  Plan Discussed with: CRNA  Anesthesia Plan Comments:        Anesthesia Quick Evaluation

## 2023-03-19 LAB — SURGICAL PATHOLOGY

## 2023-03-21 NOTE — Anesthesia Postprocedure Evaluation (Signed)
Anesthesia Post Note  Patient: Brandi Moyer  Procedure(s) Performed: COLONOSCOPY WITH PROPOFOL POLYPECTOMY  Patient location during evaluation: Phase II Anesthesia Type: General Level of consciousness: awake Pain management: pain level controlled Vital Signs Assessment: post-procedure vital signs reviewed and stable Respiratory status: spontaneous breathing and respiratory function stable Cardiovascular status: blood pressure returned to baseline and stable Postop Assessment: no headache and no apparent nausea or vomiting Anesthetic complications: no Comments: Late entry   No notable events documented.   Last Vitals:  Vitals:   03/18/23 0902 03/18/23 0907  BP: (!) 99/52 107/62  Pulse: 95   Resp: (!) 95 (!) 94  Temp:    SpO2: 94% 94%    Last Pain:  Vitals:   03/18/23 0902  TempSrc: Oral  PainSc: 0-No pain                 Windell Norfolk

## 2023-03-26 ENCOUNTER — Encounter (HOSPITAL_COMMUNITY): Payer: Self-pay | Admitting: Internal Medicine

## 2023-10-02 ENCOUNTER — Encounter (HOSPITAL_COMMUNITY): Payer: Self-pay | Admitting: Speech Pathology

## 2023-10-02 ENCOUNTER — Other Ambulatory Visit: Payer: Self-pay

## 2023-10-02 ENCOUNTER — Ambulatory Visit (HOSPITAL_COMMUNITY): Attending: Otolaryngology | Admitting: Speech Pathology

## 2023-10-02 DIAGNOSIS — R490 Dysphonia: Secondary | ICD-10-CM | POA: Diagnosis present

## 2023-10-02 NOTE — Therapy (Signed)
 OUTPATIENT SPEECH LANGUAGE PATHOLOGY VOICE EVALUATION   Patient Name: Brandi Moyer MRN: [id number] DOB:1950/09/25, 73 y.o., female Today's Date: 10/02/2023  PCP: Wendi Ham, NP REFERRING PROVIDER: Everardo Hitch, MD  END OF SESSION:  End of Session - 10/02/23 0910     Visit Number 1    Authorization Type [employer] H[employer]    SLP Start Time [phone]    SLP Stop Time  0930    SLP Time Calculation (min) 44 min    Activity Tolerance Patient tolerated treatment well            eff 06/11/23 ded-0 [id number] met 30.00 copay-25.00 auth-yes humana   History reviewed. No pertinent past medical history. Past Surgical History:  Procedure Laterality Date   COLONOSCOPY N/A 09/13/2016   Procedure: COLONOSCOPY;  Surgeon: Alyce Jubilee, MD;  Location: [facility];  Service: Endoscopy;  Laterality: N/A;  8:30 AM   COLONOSCOPY WITH PROPOFOL  N/A 03/18/2023   Procedure: COLONOSCOPY WITH PROPOFOL ;  Surgeon: Vinetta Greening, DO;  Location: [facility];  Service: Endoscopy;  Laterality: N/A;  830am, asa 2   EYE SURGERY     does not remember which eye   POLYPECTOMY  03/18/2023   Procedure: POLYPECTOMY;  Surgeon: Vinetta Greening, DO;  Location: [facility];  Service: Endoscopy;;   TUBAL LIGATION     Patient Active Problem List   Diagnosis Date Noted   Special screening for malignant neoplasms, colon     Onset date: 09/04/2023  REFERRING DIAG: voice evaluation, dysphonia  THERAPY DIAG:  Dysphonia  Rationale for Evaluation and Treatment: Rehabilitation  SUBJECTIVE:   SUBJECTIVE STATEMENT: "I can't sing anymore."  Pt accompanied by: self  PERTINENT HISTORY: Brandi Moyer is a 73 year old female with a 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 She was referred for SLP evaluation and treatment by Dr. Roslyn Coombe (ENT). She feels that she has lost significant projection of her voice which affects her ability to sing at church. She denies any pain or significant  strain. In the past she did have some swallowing issues and was started on reflux medications for this. Since that she has noted improvement in the dysphagia but did not affect the voice significantly. 40 years ago she stopped smoking.  PAIN:  Are you having pain? No  FALLS: Has patient fallen in last 6 months? No, Number of falls: N/A  LIVING ENVIRONMENT: Lives with: lives with their family Lives in: House/apartment  PLOF:Level of assistance: Independent with ADLs, Independent with IADLs Employment: Retired  PATIENT GOALS:    OBJECTIVE:  Note: Objective measures were completed at Evaluation unless otherwise noted.  DIAGNOSTIC FINDINGS: Assessment/Plan  1. Dysphonia (Primary) She has persistent but fluctuating dysphonia and a relatively normal laryngoscopy today. 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 I recommend a course of voice therapy and if she does not have improvement with this she can refer to a laryngologist for stroboscopy.  2. Gastroesophageal reflux disease without esophagitis We discussed potential contribution of reflux to her dysphonia and other swallowing symptoms. She can continue this for now and can follow-up with her other prescribing provider regarding long-term risk and benefit discussion.   COGNITION: Overall cognitive status: Within functional limits for tasks assessed Areas of impairment:  N/A Functional deficits: N/A  SOCIAL HISTORY: Occupation: retired Designer, industrial/product  Water  intake: suboptimal and no water  intake Caffeine/alcohol intake: excessive and no alcohol, but 1 cup of coffee and a lot of Sundrop Daily voice use: moderate  PERCEPTUAL VOICE ASSESSMENT: Voice quality: hoarse Vocal abuse: habitual throat clearing and this is mild Resonance: normal Respiratory function: speaking on residual capacity  OBJECTIVE VOICE ASSESSMENT: Maximum phonation time for sustained "ah": 8.8 Conversational pitch average: 189.3  Hz Conversational pitch range: 166-221 Hz Conversational loudness average: 52 dB Conversational loudness range: 45-56 dB S/z ratio: 6/9 (Suggestive of dysfunction >1.0)  PATIENT REPORTED OUTCOME MEASURES (PROM): VHI: 15, EAT-10: 0, and RSI: 6  The Voice Handicap Index-10 (VHI-10) was administered. This survey is a series of questions targeting the patient's perception of his/her own voice using a scale of 0-4 (0=Never, 4=Always). Score greater than 11 is abnormal. My voice makes it difficult for people to hear me. 2  People have difficulty understanding me in a noisy room. 2  My voice difficulties restrict personal and social life. 2 I feel left out of conversations because of my voice. 1  My voice problem causes me to lose income. 0  I feel as though I have to strain to produce voice. 2  The clarity of my voice is unpredictable. 2  My voice problem upsets me. 2  My voice makes me feel handicapped. 0  People ask, "What's wrong with your voice?" 2  TOTAL SCORE:  [x]  Abnormal (raw score >11) []  Normal 15/40   Reflux Symptom Index Hoarseness or a problem with your voice 2 Clearing your throat 1 Excess throat mucous or postnasal drip 0 Difficulty swallowing food, liquids, or pills 1 Coughing after you eat or after lying down 1 Breathing difficulties or choking episodes 1 Troublesome or annoying cough 0 Sensation of something sticking in your throat or a lump in your throat 0 Heartburn, chest pain, indigestion, or stomach acid coming up 0 TOTAL SCORE: 6 []  Abnormal (raw score >13) [x]  Normal  Normative data suggests that an RSI of greater than or equal to 13 is clinically significant  Therefore, an RSI > 13 may be indicative of significant reflux disease.                                                                                                                            TREATMENT DATE: 10/02/23 Evaluation only completed this date.  PATIENT EDUCATION: Education details:  Plan for short term voice therapy for reflux education, vocal function exercises Person educated: Patient Education method: Explanation Education comprehension: verbalized understanding and needs further education  HOME EXERCISE PROGRAM: Pt will completed HEP as assigned to facilitate carryover of treatment strategies and techniques in home and community environment with written cues.  GOALS: Goals reviewed with patient? Yes  SHORT TERM GOALS: Target date: 11/30/2023   Regularly practice voice building/strengthening exercises a minimum of 5 days/week for 20+ minutes a day. Baseline: Introduced Goal status: NEW   Report understanding of management of laryngopharyngeal reflux through dietary and behavioral modifications. a. Take reflux medication correctly 80% of the time (30-60 minutes before a meal) b. Elevate the head of the bed by 4-6" c.  Avoid eating 2-3 hours before lying down 80% of the time   Baseline: Introduced today Goal status: NEW   3. The patient will implement appropriate vocal hygiene recommendations on a daily basis.  Baseline: introduced, need to increase water  intake Goal status: NEW  4.  Patient will demonstrate balanced coordination of respiration with phonation, as demonstrated by continuous periodicity (no glottal fry) with 85% or more accuracy at the sentence, paragraph level and in conversation.  Baseline:  Goal status: NEW   5.  Decrease throat clearing/cough by 80% via reduction in laryngeal hypersensitivity and suppression via: sip liquid, swallow, follow reflux recommendations, increase hydration to 48+oz of water , and improve breath support during speech (not speaking to end of breath). Baseline: 5x in 40 minutes, Pt drinks no water  Goal status: NEW    LONG TERM GOALS: Target date: Same as short term goals   ASSESSMENT:  CLINICAL IMPRESSION: (from initial evaluation) Patient is a 73 y.o. female who was seen today for voice evaluation. Pt reports  that her voice is a 9/10 (10 being normal) today, however reports fluctuation and difficulty singing. 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 She is taking omeprazole for reflux and indicates that this has improved the "choking" and globus sensation she was experiencing. She was leading the church choir and is now not singing at all due to voice changes. Pt reports reduced ability to project her voice, strain to produce voice, and reduced vocal quality with increased vocal demands. Recommend short term voice therapy to provide education regarding vocal hygiene, reflux precautions, and implementation of vocal function/strengthening exercises. Pt is in agreement with plan of care.  OBJECTIVE IMPAIRMENTS: include voice disorder. These impairments are limiting patient from effectively communicating at home and in community. Factors affecting potential to achieve goals and functional outcome are previous level of function.. Patient will benefit from skilled SLP services to address above impairments and improve overall function.  REHAB POTENTIAL: Good  PLAN:  SLP FREQUENCY: 1x/week  SLP DURATION: 3 weeks  PLANNED INTERVENTIONS: Cueing hierachy, Internal/external aids, SLP instruction and feedback, Compensatory strategies, Patient/family education, [phone] Treatment of speech (30 or 45 min) , and 60454- Speech Eval Behavioral Qualitative Voice Resonance   Thank you,  Claudetta Cuba, CCC-SLP [phone]  Aceyn Kathol, CCC-SLP 10/02/2023, 9:12 AM  [employer] Auth Request  Referring diagnosis code (ICD 10)? 49.0 Treatment diagnosis codes (ICD 10)? (if different than referring diagnosis) 49.0 dysphonia What was this (referring dx) caused by? []  Surgery []  Fall []  Ongoing issue []  Arthritis [x]  Other: ___________  Laterality: []  Rt []  Lt [x]  Both  Deficits: []  Pain []  Stiffness []  Weakness []  Edema []  Balance Deficits []   Coordination []  Gait Disturbance []  ROM [x]  Other   Functional Tool Score: N/A  CPT codes: See Planned Interventions listed in the Plan section of the Evaluation.

## 2023-10-30 ENCOUNTER — Encounter (HOSPITAL_COMMUNITY): Payer: Self-pay | Admitting: Speech Pathology

## 2023-10-30 ENCOUNTER — Ambulatory Visit (HOSPITAL_COMMUNITY): Attending: Otolaryngology | Admitting: Speech Pathology

## 2023-10-30 DIAGNOSIS — R490 Dysphonia: Secondary | ICD-10-CM | POA: Insufficient documentation

## 2023-10-30 NOTE — Therapy (Signed)
 OUTPATIENT SPEECH LANGUAGE PATHOLOGY VOICE TREATMENT   Patient Name: Brandi Moyer MRN: [id number] DOB:Dec 06, 1950, 73 y.o., female Today's Date: 10/30/2023  PCP: Wendi Ham, NP REFERRING PROVIDER: Everardo Hitch, MD  END OF SESSION:  End of Session - 10/30/23 0906     Visit Number 2    Number of Visits 4    Authorization Type [employer] H[employer]   eff 06/11/23 ded-0 [id number] met 30.00 copay-25.00 auth-yes humana   SLP Start Time [phone]    SLP Stop Time  0930    SLP Time Calculation (min) 44 min    Activity Tolerance Patient tolerated treatment well             History reviewed. No pertinent past medical history. Past Surgical History:  Procedure Laterality Date   COLONOSCOPY N/A 09/13/2016   Procedure: COLONOSCOPY;  Surgeon: Alyce Jubilee, MD;  Location: [facility];  Service: Endoscopy;  Laterality: N/A;  8:30 AM   COLONOSCOPY WITH PROPOFOL  N/A 03/18/2023   Procedure: COLONOSCOPY WITH PROPOFOL ;  Surgeon: Vinetta Greening, DO;  Location: [facility];  Service: Endoscopy;  Laterality: N/A;  830am, asa 2   EYE SURGERY     does not remember which eye   POLYPECTOMY  03/18/2023   Procedure: POLYPECTOMY;  Surgeon: Vinetta Greening, DO;  [facility]: [facility];  Service: Endoscopy;;   TUBAL LIGATION     Patient Active Problem List   Diagnosis Date Noted   Special screening for malignant neoplasms, colon     Onset date: 09/04/2023  REFERRING DIAG: voice evaluation, dysphonia  THERAPY DIAG:  Dysphonia  Rationale for Evaluation and Treatment: Rehabilitation  SUBJECTIVE:   SUBJECTIVE STATEMENT: "I don't really drink any water ."  Pt accompanied by: self  PERTINENT HISTORY: Brandi Moyer is a 73 year old female with a 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 She was referred for SLP evaluation and treatment by Dr. Roslyn Coombe (ENT). She feels that she has lost significant projection of her voice which affects her ability to sing at church. She denies  any pain or significant strain. In the past she did have some swallowing issues and was started on reflux medications for this. Since that she has noted improvement in the dysphagia but did not affect the voice significantly. 40 years ago she stopped smoking.  PAIN:  Are you having pain? No  FALLS: Has patient fallen in last 6 months? No, Number of falls: N/A  LIVING ENVIRONMENT: Lives with: lives with their family Lives in: House/apartment  PLOF:Level of assistance: Independent with ADLs, Independent with IADLs Employment: Retired  PATIENT GOALS:    OBJECTIVE:  Note: Objective measures were completed at Evaluation unless otherwise noted.  DIAGNOSTIC FINDINGS: Assessment/Plan  1. Dysphonia (Primary) She has persistent but fluctuating dysphonia and a relatively normal laryngoscopy today. 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 I recommend a course of voice therapy and if she does not have improvement with this she can refer to a laryngologist for stroboscopy.  2. Gastroesophageal reflux disease without esophagitis We discussed potential contribution of reflux to her dysphonia and other swallowing symptoms. She can continue this for now and can follow-up with her other prescribing provider regarding long-term risk and benefit discussion.   COGNITION: Overall cognitive status: Within functional limits for tasks assessed Areas of impairment:  N/A Functional deficits: N/A  SOCIAL HISTORY: Occupation: retired Designer, industrial/product  Water  intake: suboptimal and no water  intake Caffeine/alcohol intake: excessive and no alcohol, but 1 cup of coffee and  a lot of [place] Daily voice use: moderate  PERCEPTUAL VOICE ASSESSMENT: Voice quality: hoarse Vocal abuse: habitual throat clearing and this is mild Resonance: normal Respiratory function: speaking on residual capacity  OBJECTIVE VOICE ASSESSMENT: Maximum phonation time for sustained "ah": 8.8 Conversational pitch  average: 189.3 Hz Conversational pitch range: 166-221 Hz Conversational loudness average: 52 dB Conversational loudness range: 45-56 dB S/z ratio: 6/9 (Suggestive of dysfunction >1.0)                                                                                                                           TREATMENT DATE: 10/30/23 Pt did not bring her folder back in today, but reports that she read through the information. She has been making efforts to decrease throat clearing, no effort in increasing water  intake. She also indicates that she sings at church, but stops singing when the pitch is too high. Vocal quality is clear, no hoarseness noted. Her primary complaint continues to be her inability to sing at pitches she previously reached. She indicates that when she tries to sing the high pitches, she "loses" her voice for several hours to days. This has not happened recently. SLP lead Pt through vocal function exercises with modeling provided. Pt was then able to return demonstrate with use of written cues with min assist. Her goal for sustaining "eee" will be set to 12 seconds (varied from 7.5-13.8 today). Pt required cues to refrain from "holding" and producing the sounds quietly (as this increases tension). She was encouraged to complete HEP 2x daily going forward.   PATIENT EDUCATION: Education details: vocal function exercises Person educated: Patient Education method: Explanation Education comprehension: verbalized understanding and needs further education  HOME EXERCISE PROGRAM: Pt will completed HEP as assigned to facilitate carryover of treatment strategies and techniques in home and community environment with written cues.  GOALS: Goals reviewed with patient? Yes  SHORT TERM GOALS: Target date: 11/30/2023   Regularly practice voice building/strengthening exercises a minimum of 5 days/week for 20+ minutes a day. Baseline: Introduced Goal status: ONGOING   Report understanding of  management of laryngopharyngeal reflux through dietary and behavioral modifications. a. Take reflux medication correctly 80% of the time (30-60 minutes before a meal) b. Elevate the head of the bed by 4-6" c. Avoid eating 2-3 hours before lying down 80% of the time   Baseline: Introduced today Goal status: ONGOING   3. The patient will implement appropriate vocal hygiene recommendations on a daily basis.  Baseline: introduced, need to increase water  intake Goal status: ONGOING  4.  Patient will demonstrate balanced coordination of respiration with phonation, as demonstrated by continuous periodicity (no glottal fry) with 85% or more accuracy at the sentence, paragraph level and in conversation.  Baseline:  Goal status: ONGOING   5.  Decrease throat clearing/cough by 80% via reduction in laryngeal hypersensitivity and suppression via: sip liquid, swallow, follow reflux recommendations, increase hydration to 48+oz of water , and improve breath support during  speech (not speaking to end of breath). Baseline: 5x in 40 minutes, Pt drinks no water  Goal status: ONGOING    LONG TERM GOALS: Target date: Same as short term goals   ASSESSMENT:  CLINICAL IMPRESSION: (from initial evaluation) Patient is a 73 y.o. female who was seen today for voice evaluation. Pt reports that her voice is a 9/10 (10 being normal) today, however reports fluctuation and difficulty singing. 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 She is taking omeprazole for reflux and indicates that this has improved the "choking" and globus sensation she was experiencing. She was leading the church choir and is now not singing at all due to voice changes. Pt reports reduced ability to project her voice, strain to produce voice, and reduced vocal quality with increased vocal demands. Recommend short term voice therapy to provide education regarding vocal hygiene, reflux precautions, and  implementation of vocal function/strengthening exercises. Pt is in agreement with plan of care.  OBJECTIVE IMPAIRMENTS: include voice disorder. These impairments are limiting patient from effectively communicating at home and in community. Factors affecting potential to achieve goals and functional outcome are previous level of function.. Patient will benefit from skilled SLP services to address above impairments and improve overall function.  REHAB POTENTIAL: Good  PLAN:  SLP FREQUENCY: 1x/week  SLP DURATION: 3 weeks  PLANNED INTERVENTIONS: Cueing hierachy, Internal/external aids, SLP instruction and feedback, Compensatory strategies, Patient/family education, [phone] Treatment of speech (30 or 45 min) , and 62130- Speech Eval Behavioral Qualitative Voice Resonance   Thank you,  Claudetta Cuba, CCC-SLP [phone]  Moneka Mcquinn, CCC-SLP 10/30/2023, 9:40 AM

## 2023-11-06 ENCOUNTER — Encounter (HOSPITAL_COMMUNITY): Payer: Self-pay | Admitting: Speech Pathology

## 2023-11-06 ENCOUNTER — Ambulatory Visit (HOSPITAL_COMMUNITY): Admitting: Speech Pathology

## 2023-11-06 DIAGNOSIS — R490 Dysphonia: Secondary | ICD-10-CM | POA: Diagnosis not present

## 2023-11-06 NOTE — Therapy (Signed)
 OUTPATIENT SPEECH LANGUAGE PATHOLOGY VOICE TREATMENT   Patient Name: Brandi Moyer MRN: [id number] DOB:1950-12-28, 73 y.o., female Today's Date: 11/06/2023  PCP: Wendi Ham, NP REFERRING PROVIDER: Everardo Hitch, MD  END OF SESSION:  End of Session - 11/06/23 0849     Visit Number 3    Number of Visits 4    Authorization Type [employer] H[employer]   eff 06/11/23 ded-0 [id number] met 30.00 copay-25.00 auth-yes humana   SLP Start Time 0847    SLP Stop Time  0930    SLP Time Calculation (min) 43 min    Activity Tolerance Patient tolerated treatment well             History reviewed. No pertinent past medical history. Past Surgical History:  Procedure Laterality Date   COLONOSCOPY N/A 09/13/2016   Procedure: COLONOSCOPY;  Surgeon: Alyce Jubilee, MD;  Location: [facility];  Service: Endoscopy;  Laterality: N/A;  8:30 AM   COLONOSCOPY WITH PROPOFOL  N/A 03/18/2023   Procedure: COLONOSCOPY WITH PROPOFOL ;  Surgeon: Vinetta Greening, DO;  Location: [facility];  Service: Endoscopy;  Laterality: N/A;  830am, asa 2   EYE SURGERY     does not remember which eye   POLYPECTOMY  03/18/2023   Procedure: POLYPECTOMY;  Surgeon: Vinetta Greening, DO;  Location: [facility];  Service: Endoscopy;;   TUBAL LIGATION     Patient Active Problem List   Diagnosis Date Noted   Special screening for malignant neoplasms, colon     Onset date: 09/04/2023  REFERRING DIAG: voice evaluation, dysphonia  THERAPY DIAG:  Dysphonia  Rationale for Evaluation and Treatment: Rehabilitation  SUBJECTIVE:   SUBJECTIVE STATEMENT: "I think my talking voice is fine."  Pt accompanied by: self  PERTINENT HISTORY: [name] is a 73 year old female with a 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 She was referred for SLP evaluation and treatment by Dr. Roslyn Coombe (ENT). She feels that she has lost significant projection of her voice which affects her ability to sing at church. She  denies any pain or significant strain. In the past she did have some swallowing issues and was started on reflux medications for this. Since that she has noted improvement in the dysphagia but did not affect the voice significantly. 40 years ago she stopped smoking.  PAIN:  Are you having pain? No  FALLS: Has patient fallen in last 6 months? No, Number of falls: N/A  LIVING ENVIRONMENT: Lives with: lives with their family Lives in: House/apartment  PLOF:Level of assistance: Independent with ADLs, Independent with IADLs Employment: Retired  PATIENT GOALS:    OBJECTIVE:  Note: Objective measures were completed at Evaluation unless otherwise noted.  DIAGNOSTIC FINDINGS: Assessment/Plan  1. Dysphonia (Primary) She has persistent but fluctuating dysphonia and a relatively normal laryngoscopy today. 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 I recommend a course of voice therapy and if she does not have improvement with this she can refer to a laryngologist for stroboscopy.  2. Gastroesophageal reflux disease without esophagitis We discussed potential contribution of reflux to her dysphonia and other swallowing symptoms. She can continue this for now and can follow-up with her other prescribing provider regarding long-term risk and benefit discussion.   COGNITION: Overall cognitive status: Within functional limits for tasks assessed Areas of impairment:  N/A Functional deficits: N/A  SOCIAL HISTORY: Occupation: retired Designer, industrial/product  Water  intake: suboptimal and no water  intake Caffeine/alcohol intake: excessive and no alcohol, but 1 cup of coffee  and a lot of Sundrop Daily voice use: moderate  PERCEPTUAL VOICE ASSESSMENT: Voice quality: hoarse Vocal abuse: habitual throat clearing and this is mild Resonance: normal Respiratory function: speaking on residual capacity  OBJECTIVE VOICE ASSESSMENT: Maximum phonation time for sustained "ah": 8.8 Conversational  pitch average: 189.3 Hz Conversational pitch range: 166-221 Hz Conversational loudness average: 52 dB Conversational loudness range: 45-56 dB S/z ratio: 6/9 (Suggestive of dysfunction >1.0)   PREVIOUS TREATMENT : Pt did not bring her folder back in today, but reports that she read through the information. She has been making efforts to decrease throat clearing, no effort in increasing water  intake. She also indicates that she sings at church, but stops singing when the pitch is too high. Vocal quality is clear, no hoarseness noted. Her primary complaint continues to be her inability to sing at pitches she previously reached. She indicates that when she tries to sing the high pitches, she "loses" her voice for several hours to days. This has not happened recently. SLP lead Pt through vocal function exercises with modeling provided. Pt was then able to return demonstrate with use of written cues with min assist. Her goal for sustaining "eee" will be set to 12 seconds (varied from 7.5-13.8 today). Pt required cues to refrain from "holding" and producing the sounds quietly (as this increases tension). She was encouraged to complete HEP 2x daily going forward.                                                                                                                       TREATMENT DATE: 11/06/23 Pt reports implementing all vocal function exercises on a daily basis at home. She says that she still avoids certain pitches that are out of her range when singing at church for fear of losing her voice. Pt reports feeling satisfied with her speaking voice and only has "problems" with it when she tries to sing to high at church. Suspect this occurred last summer when she lead the church choir for 2 weeks last summer after never having demanded that much from her voice before. She started a PPI in October which has improved the globus sensation. In session, she completed all vocal function exercises with rare min cue  with use of written prompts as needed. She is adhering to reflux precautions and was given information on alginate therapy and was advised to discuss with her doctor. She still does not drink any water  in the day and liquid intake consists of: Sundrop, one cup of coffee, and sweet tea. She was encouraged to try cranberry or similar juice and cut it with water . Pt is satisfied with her current level of function and will be discharged to voice therapy home program going forward.   PATIENT EDUCATION: Education details: vocal function exercises Person educated: Patient Education method: Explanation Education comprehension: verbalized understanding and needs further education  HOME EXERCISE PROGRAM: Pt will completed HEP as assigned to facilitate carryover of treatment strategies and techniques in home and  community environment with written cues.  GOALS: Goals reviewed with patient? Yes  SHORT TERM GOALS: Target date: 11/30/2023   Regularly practice voice building/strengthening exercises a minimum of 5 days/week for 20+ minutes a day. Baseline: Introduced Goal status: MET   Report understanding of management of laryngopharyngeal reflux through dietary and behavioral modifications. a. Take reflux medication correctly 80% of the time (30-60 minutes before a meal) b. Elevate the head of the bed by 4-6" c. Avoid eating 2-3 hours before lying down 80% of the time   Baseline: Introduced today Goal status: MET   3. The patient will implement appropriate vocal hygiene recommendations on a daily basis.  Baseline: introduced, need to increase water  intake Goal status: Partially MET, still does not drink water   4.  Patient will demonstrate balanced coordination of respiration with phonation, as demonstrated by continuous periodicity (no glottal fry) with 85% or more accuracy at the sentence, paragraph level and in conversation.  Baseline:  Goal status: MET   5.  Decrease throat clearing/cough by  80% via reduction in laryngeal hypersensitivity and suppression via: sip liquid, swallow, follow reflux recommendations, increase hydration to 48+oz of water , and improve breath support during speech (not speaking to end of breath). Baseline: 5x in 40 minutes, Pt drinks no water  Goal status: partially met   LONG TERM GOALS: Target date: Same as short term goals   ASSESSMENT:  CLINICAL IMPRESSION: (from initial evaluation) Patient is a 73 y.o. female who was seen today for voice evaluation. Pt reports that her voice is a 9/10 (10 being normal) today, however reports fluctuation and difficulty singing. 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 She is taking omeprazole for reflux and indicates that this has improved the "choking" and globus sensation she was experiencing. She was leading the church choir and is now not singing at all due to voice changes. Pt reports reduced ability to project her voice, strain to produce voice, and reduced vocal quality with increased vocal demands. Recommend short term voice therapy to provide education regarding vocal hygiene, reflux precautions, and implementation of vocal function/strengthening exercises. Pt is in agreement with plan of care.  OBJECTIVE IMPAIRMENTS: include voice disorder. These impairments are limiting patient from effectively communicating at home and in community. Factors affecting potential to achieve goals and functional outcome are previous level of function.. Patient will benefit from skilled SLP services to address above impairments and improve overall function.  REHAB POTENTIAL: Good  PLAN:  PLANNED INTERVENTIONS: Cueing hierachy, Internal/external aids, SLP instruction and feedback, Compensatory strategies, Patient/family education, [phone] Treatment of speech (30 or 45 min) , and [id number]- Speech Eval Behavioral Qualitative Voice Resonance  SPEECH THERAPY DISCHARGE SUMMARY  Visits  from Start of Care: 3  Current functional level related to goals / functional outcomes: Goals met, see above   Remaining deficits: WNL speaking voice, reduced pitch range for singing   Education / Equipment: Continue with vocal function exercises going forward   Patient agrees to discharge. Patient goals were met. Patient is being discharged due to being pleased with the current functional level..    Thank you,  Claudetta Cuba, CCC-SLP [phone]  Faun Mcqueen, CCC-SLP 11/06/2023, 8:54 AM

## 2023-11-10 ENCOUNTER — Ambulatory Visit (HOSPITAL_COMMUNITY): Admitting: Speech Pathology

## 2024-02-06 DIAGNOSIS — Z136 Encounter for screening for cardiovascular disorders: Secondary | ICD-10-CM | POA: Diagnosis not present

## 2024-02-06 DIAGNOSIS — Z0000 Encounter for general adult medical examination without abnormal findings: Secondary | ICD-10-CM | POA: Diagnosis not present

## 2024-02-06 DIAGNOSIS — R7303 Prediabetes: Secondary | ICD-10-CM | POA: Diagnosis not present

## 2024-02-12 DIAGNOSIS — Z0000 Encounter for general adult medical examination without abnormal findings: Secondary | ICD-10-CM | POA: Diagnosis not present

## 2024-02-12 DIAGNOSIS — R7303 Prediabetes: Secondary | ICD-10-CM | POA: Diagnosis not present

## 2024-02-12 DIAGNOSIS — E785 Hyperlipidemia, unspecified: Secondary | ICD-10-CM | POA: Diagnosis not present

## 2024-02-12 DIAGNOSIS — Z2821 Immunization not carried out because of patient refusal: Secondary | ICD-10-CM | POA: Diagnosis not present

## 2024-02-12 DIAGNOSIS — K219 Gastro-esophageal reflux disease without esophagitis: Secondary | ICD-10-CM | POA: Diagnosis not present

## 2024-02-12 DIAGNOSIS — Z6835 Body mass index (BMI) 35.0-35.9, adult: Secondary | ICD-10-CM | POA: Diagnosis not present

## 2024-02-12 DIAGNOSIS — Z79899 Other long term (current) drug therapy: Secondary | ICD-10-CM | POA: Diagnosis not present

## 2024-04-20 DIAGNOSIS — Z87891 Personal history of nicotine dependence: Secondary | ICD-10-CM | POA: Diagnosis not present

## 2024-04-20 DIAGNOSIS — Z8249 Family history of ischemic heart disease and other diseases of the circulatory system: Secondary | ICD-10-CM | POA: Diagnosis not present

## 2024-04-20 DIAGNOSIS — Z809 Family history of malignant neoplasm, unspecified: Secondary | ICD-10-CM | POA: Diagnosis not present

## 2024-04-20 DIAGNOSIS — K219 Gastro-esophageal reflux disease without esophagitis: Secondary | ICD-10-CM | POA: Diagnosis not present

## 2024-04-20 DIAGNOSIS — E785 Hyperlipidemia, unspecified: Secondary | ICD-10-CM | POA: Diagnosis not present

## 2024-04-20 DIAGNOSIS — N182 Chronic kidney disease, stage 2 (mild): Secondary | ICD-10-CM | POA: Diagnosis not present

## 2024-04-20 DIAGNOSIS — M8580 Other specified disorders of bone density and structure, unspecified site: Secondary | ICD-10-CM | POA: Diagnosis not present

## 2024-04-20 DIAGNOSIS — E669 Obesity, unspecified: Secondary | ICD-10-CM | POA: Diagnosis not present

## 2024-04-20 DIAGNOSIS — J309 Allergic rhinitis, unspecified: Secondary | ICD-10-CM | POA: Diagnosis not present
